# Patient Record
Sex: Female | Born: 1965 | Race: White | Hispanic: No | Marital: Married | Smoking: Current every day smoker
Health system: Southern US, Community
[De-identification: ages and names within clinical notes are randomized; demographics above are authoritative.]

## PROBLEM LIST (undated history)

## (undated) DIAGNOSIS — N1370 Vesicoureteral-reflux, unspecified: Secondary | ICD-10-CM

## (undated) DIAGNOSIS — J302 Other seasonal allergic rhinitis: Secondary | ICD-10-CM

## (undated) DIAGNOSIS — Z87442 Personal history of urinary calculi: Secondary | ICD-10-CM

## (undated) HISTORY — DX: Other seasonal allergic rhinitis: J30.2

## (undated) HISTORY — DX: Personal history of urinary calculi: Z87.442

## (undated) HISTORY — PX: BLADDER SURGERY: SHX569

## (undated) HISTORY — PX: TONSILLECTOMY AND ADENOIDECTOMY: SHX28

## (undated) HISTORY — DX: Vesicoureteral-reflux, unspecified: N13.70

---

## 1998-11-24 HISTORY — PX: BREAST SURGERY: SHX581

## 2000-05-25 ENCOUNTER — Other Ambulatory Visit: Admission: RE | Admit: 2000-05-25 | Discharge: 2000-05-25 | Payer: Self-pay | Admitting: Gynecology

## 2001-05-28 ENCOUNTER — Other Ambulatory Visit: Admission: RE | Admit: 2001-05-28 | Discharge: 2001-05-28 | Payer: Self-pay | Admitting: Obstetrics and Gynecology

## 2003-02-21 ENCOUNTER — Other Ambulatory Visit: Admission: RE | Admit: 2003-02-21 | Discharge: 2003-02-21 | Payer: Self-pay | Admitting: Gynecology

## 2003-08-19 ENCOUNTER — Emergency Department (HOSPITAL_COMMUNITY): Admission: EM | Admit: 2003-08-19 | Discharge: 2003-08-19 | Payer: Self-pay

## 2003-08-19 ENCOUNTER — Encounter: Payer: Self-pay | Admitting: Emergency Medicine

## 2004-06-19 ENCOUNTER — Emergency Department (HOSPITAL_COMMUNITY): Admission: EM | Admit: 2004-06-19 | Discharge: 2004-06-19 | Payer: Self-pay

## 2004-06-20 ENCOUNTER — Ambulatory Visit (HOSPITAL_COMMUNITY): Admission: RE | Admit: 2004-06-20 | Discharge: 2004-06-20 | Payer: Self-pay | Admitting: Family Medicine

## 2004-07-22 ENCOUNTER — Inpatient Hospital Stay (HOSPITAL_COMMUNITY): Admission: AD | Admit: 2004-07-22 | Discharge: 2004-07-22 | Payer: Self-pay | Admitting: Obstetrics and Gynecology

## 2004-10-11 ENCOUNTER — Other Ambulatory Visit: Admission: RE | Admit: 2004-10-11 | Discharge: 2004-10-11 | Payer: Self-pay | Admitting: Gynecology

## 2005-12-26 ENCOUNTER — Other Ambulatory Visit: Admission: RE | Admit: 2005-12-26 | Discharge: 2005-12-26 | Payer: Self-pay | Admitting: Gynecology

## 2007-01-19 ENCOUNTER — Ambulatory Visit (HOSPITAL_COMMUNITY): Admission: RE | Admit: 2007-01-19 | Discharge: 2007-01-19 | Payer: Self-pay | Admitting: Obstetrics and Gynecology

## 2007-02-09 ENCOUNTER — Ambulatory Visit (HOSPITAL_COMMUNITY): Admission: RE | Admit: 2007-02-09 | Discharge: 2007-02-09 | Payer: Self-pay | Admitting: Obstetrics and Gynecology

## 2007-03-10 ENCOUNTER — Ambulatory Visit (HOSPITAL_COMMUNITY): Admission: RE | Admit: 2007-03-10 | Discharge: 2007-03-10 | Payer: Self-pay | Admitting: Obstetrics and Gynecology

## 2007-03-26 ENCOUNTER — Ambulatory Visit (HOSPITAL_COMMUNITY): Admission: RE | Admit: 2007-03-26 | Discharge: 2007-03-26 | Payer: Self-pay | Admitting: Obstetrics and Gynecology

## 2007-04-09 ENCOUNTER — Ambulatory Visit (HOSPITAL_COMMUNITY): Admission: RE | Admit: 2007-04-09 | Discharge: 2007-04-09 | Payer: Self-pay | Admitting: Obstetrics and Gynecology

## 2007-05-05 ENCOUNTER — Inpatient Hospital Stay (HOSPITAL_COMMUNITY): Admission: AD | Admit: 2007-05-05 | Discharge: 2007-05-05 | Payer: Self-pay | Admitting: Obstetrics and Gynecology

## 2007-05-27 ENCOUNTER — Encounter: Admission: RE | Admit: 2007-05-27 | Discharge: 2007-05-27 | Payer: Self-pay | Admitting: Obstetrics and Gynecology

## 2007-07-31 ENCOUNTER — Inpatient Hospital Stay (HOSPITAL_COMMUNITY): Admission: AD | Admit: 2007-07-31 | Discharge: 2007-08-04 | Payer: Self-pay | Admitting: Obstetrics and Gynecology

## 2007-08-01 ENCOUNTER — Encounter (INDEPENDENT_AMBULATORY_CARE_PROVIDER_SITE_OTHER): Payer: Self-pay | Admitting: Obstetrics and Gynecology

## 2007-08-05 ENCOUNTER — Encounter: Admission: RE | Admit: 2007-08-05 | Discharge: 2007-09-03 | Payer: Self-pay | Admitting: Obstetrics and Gynecology

## 2009-06-21 ENCOUNTER — Ambulatory Visit: Payer: Self-pay | Admitting: Gynecology

## 2009-06-21 ENCOUNTER — Other Ambulatory Visit: Admission: RE | Admit: 2009-06-21 | Discharge: 2009-06-21 | Payer: Self-pay | Admitting: Gynecology

## 2009-06-21 ENCOUNTER — Encounter: Payer: Self-pay | Admitting: Gynecology

## 2009-06-28 ENCOUNTER — Ambulatory Visit: Payer: Self-pay | Admitting: Gynecology

## 2009-09-17 ENCOUNTER — Ambulatory Visit: Payer: Self-pay | Admitting: Gynecology

## 2009-10-24 ENCOUNTER — Encounter: Admission: RE | Admit: 2009-10-24 | Discharge: 2009-11-21 | Payer: Self-pay | Admitting: Gynecology

## 2010-03-11 ENCOUNTER — Other Ambulatory Visit: Admission: RE | Admit: 2010-03-11 | Discharge: 2010-03-11 | Payer: Self-pay | Admitting: Gynecology

## 2010-03-11 ENCOUNTER — Ambulatory Visit: Payer: Self-pay | Admitting: Gynecology

## 2010-12-15 ENCOUNTER — Encounter: Payer: Self-pay | Admitting: Obstetrics and Gynecology

## 2011-02-12 ENCOUNTER — Other Ambulatory Visit: Payer: Self-pay | Admitting: Gynecology

## 2011-02-12 ENCOUNTER — Ambulatory Visit (INDEPENDENT_AMBULATORY_CARE_PROVIDER_SITE_OTHER): Payer: 59 | Admitting: Gynecology

## 2011-02-12 DIAGNOSIS — R5383 Other fatigue: Secondary | ICD-10-CM

## 2011-02-12 DIAGNOSIS — N951 Menopausal and female climacteric states: Secondary | ICD-10-CM

## 2011-02-12 DIAGNOSIS — Q826 Congenital sacral dimple: Secondary | ICD-10-CM

## 2011-02-12 DIAGNOSIS — M545 Low back pain: Secondary | ICD-10-CM

## 2011-02-12 DIAGNOSIS — N912 Amenorrhea, unspecified: Secondary | ICD-10-CM

## 2011-02-17 ENCOUNTER — Ambulatory Visit (HOSPITAL_COMMUNITY)
Admission: RE | Admit: 2011-02-17 | Discharge: 2011-02-17 | Disposition: A | Discharge status: Home / Self Care | Payer: 59 | Source: Ambulatory Visit | Attending: Gynecology | Admitting: Gynecology

## 2011-02-17 ENCOUNTER — Other Ambulatory Visit: Payer: Self-pay | Admitting: Gynecology

## 2011-02-17 DIAGNOSIS — Q826 Congenital sacral dimple: Secondary | ICD-10-CM

## 2011-02-17 DIAGNOSIS — R52 Pain, unspecified: Secondary | ICD-10-CM

## 2011-02-17 DIAGNOSIS — N949 Unspecified condition associated with female genital organs and menstrual cycle: Secondary | ICD-10-CM | POA: Insufficient documentation

## 2011-02-21 ENCOUNTER — Ambulatory Visit (INDEPENDENT_AMBULATORY_CARE_PROVIDER_SITE_OTHER): Payer: 59 | Admitting: Gynecology

## 2011-02-21 DIAGNOSIS — Z79890 Hormone replacement therapy (postmenopausal): Secondary | ICD-10-CM

## 2011-02-21 DIAGNOSIS — N952 Postmenopausal atrophic vaginitis: Secondary | ICD-10-CM

## 2011-02-21 DIAGNOSIS — N951 Menopausal and female climacteric states: Secondary | ICD-10-CM

## 2011-04-08 NOTE — Discharge Summary (Signed)
Samantha Jensen, Samantha Jensen             ACCOUNT NO.:  [id number]   MEDICAL RECORD NO.:  [id number]          PATIENT TYPE:  INP   LOCATION:  9130                          FACILITY:  [facility]   PHYSICIAN:  Sherron Monday, MD        DATE OF BIRTH:  February 12, 1966   DATE OF ADMISSION:  07/31/2007  DATE OF DISCHARGE:  08/04/2007                               DISCHARGE SUMMARY   ADMITTING DIAGNOSIS:  Intrauterine pregnancy at term, spontaneous  rupture of membranes.   DISCHARGE DIAGNOSES:  Intrauterine pregnancy at term, spontaneous  rupture of membranes, delivered via spontaneous vaginal delivery.  Labor  complicated by fetal tachycardia, chorioamnionitis, and failure to  progress.   HISTORY OF PRESENT ILLNESS:  Patient is a 45 year old, G3, P0-0-2-0, at  40+ weeks with contractions, leaking fluid loss, and she also lost her  mucus plug.  She is uncomfortable with some of the contractions.  She  states she has had good fetal movement, some spotting.  Pregnancy was  complicated by being an IVF pregnancy, initially being a twin pregnancy  with demise of Twin B.  There was early vaginal bleeding during the  pregnancy secondary to a subchorionic hemorrhage and the pregnancy was  complicated by A1 gestational diabetes.   PAST MEDICAL HISTORY:  Significant for herpes, type 1.   PAST SURGICAL HISTORY:  Significant for tonsillectomy, left kidney  surgery, bladder surgery.   PAST OB/GYN HISTORY:  G1 and G2 were therapeutic abortions.  This is  confidential.  G3, the present pregnancy by IVF with donor eggs with the  demise of Twin B and a date of conception of November 02, 2006, giving  her an [facility] of July 28, 2007.  No abnormal Pap smears.  No sexually-  transmitted diseases.   MEDICATIONS:  Prenatal vitamins.   ALLERGIES:  No known drug allergies.   SOCIAL HISTORY:  Denies alcohol, tobacco or drug use.  She has a history  of tobacco use, but quit with the pregnancy.  She is married.   FAMILY  HISTORY:  Remarkable for congestive heart failure in maternal  grandfather.  Maternal grandmother with pancreatic cancer.  Father with  COPD.   PRENATAL LABS:  Hemoglobin 13.4, platelets 159,000.  A-negative,  antibody screen negative, gonorrhea negative, Chlamydia negative, RPR  nonreactive, rubella immune, hepatitis C surface antigen negative, HIV  negative.  Cystic fibrosis screen negative.  Glucola 197.  Two hour GTT  93, 184, 155, 125.  Group B strep was negative.   Ultrasound on June 18, 2007, at 34 weeks, revealed Baby A being 2537  grams, normal anatomy, posterior placenta, vertex and female infant.  B  being calcified in the upper uterus and as a demise.  Multiple other  ultrasounds initially revealing discordant growth, then fetal demise.  Most recent of which was July 12, 2007, revealing good growth, normal  anatomy, and a vertex presentation.   ADMISSION PHYSICAL EXAMINATION:  Afebrile, vital signs stable.  GENERAL:  No apparent distress.  CARDIOVASCULAR:  Regular rate and rhythm.  LUNGS:  Clear to auscultation bilaterally.  ABDOMEN:  Soft with a nontender fundus.  EXTREMITIES:  Symmetric and nontender.  FETAL HEART TONES:  135 and reactive.  TOCOMETRY:  Contracting every four to five minutes.  STERILE VAGINAL EXAM:  Revealed 1-2 cm dilated, 80% effaced and -3  station.  Sterile speculum exam revealed ferning, pooling and  questionable Nitrazine secondary to small amount of vaginal bleeding.   She was admitted for labor induction.  Her labor was augmented with  Pitocin.  Throughout her antepartum course, she slowly progressed with  rupture of forebag.  She had thin meconium.  An IUPC was placed with  amnioinfusion started, secondary to questionable meconium of possibly  dark fluid secondary to Baby B's demise.  Pitocin was continued to be  increased.  Patient was found to be 4-5 cm, 100% effaced at 0 station.  However, after vaginal exam at this point, fetal  tachycardia was noted  at 170s to 180s, consistent for a half hour.  Discussed with the patient  the SROM and slow or minimal change in the fetal tachycardia being  indications for cesarean section.  We discussed with her risks,  benefits, and alternatives, including bleeding, infection, damage to  surrounding organs, including, but not limited to, bowel, bladder,  ureters and blood vessels.  Also trouble healing.  We answered her  questions, so the decision was made to proceed with a cesarean section.  Viable female infant was delivered at 5:44 a.m. on September 7, with  Apgars of 8 at one minute and 9 at five minutes and a good venous gas of  7.31.  Weight was 8 pounds 14 ounces.  On delivery of the placentas,  they were sent to pathology and the small, skeletal remains of Twin B  were noted.  EBL was approximately 700 mL.   Her postoperative course was uncomplicated, remaining afebrile with  vital signs stable since she was maintained on gentamicin, ampicillin  and clindamycin initially, secondary to her fever and labor.  However,  she had some swelling and was changed to cefoxitin.  Her IV infiltrated  after approximately 24 hours and, as she had been afebrile, the  antibiotics were discontinued at this time.  The baby was transferred to  the NICU, secondary to tachypnea.  She desired discharge home on  postoperative day #3.  She was doing well, tolerating p.o., ambulating  well.  Her pain was well-controlled and normal lochia.  Benign exam.  Staples were removed.  She will be discharged to home with routine  discharge instructions and numbers to call for any questions or  problems, as well as prescriptions for Motrin, Vicodin and prenatal  vitamins and she will follow up in two weeks for an incision check.   DISCHARGE INFORMATION:  She is rubella-immune, A-negative.  Breast-  feeds.  Hemoglobin decreased from 12.2 to 8.8.  We will discuss  contraception at her postpartum checkups.   Prior to discharge, she was  given RhoGAM, secondary to the infant's Rh status.      Sherron Monday, MD  Electronically Signed     JB/MEDQ  D:  08/04/2007  T:  08/04/2007  Job:  [id number]

## 2011-04-08 NOTE — Op Note (Signed)
NAME:  Samantha Jensen, Samantha Jensen             ACCOUNT NO.:  [id number]   MEDICAL RECORD NO.:  [id number]          PATIENT TYPE:  INP   LOCATION:  9130                          FACILITY:  [facility]   PHYSICIAN:  Sherron Monday, MD        DATE OF BIRTH:  11/09/1966   DATE OF PROCEDURE:  08/01/2007  DATE OF DISCHARGE:                               OPERATIVE REPORT   PREOPERATIVE DIAGNOSIS:  Intrauterine pregnancy at term, history of  demise of twin B, chorioamnionitis, fetal tachycardia, arrest of  dilatation.   POSTOPERATIVE DIAGNOSES:  1. Intrauterine pregnancy at term, history of demise of twin B,      chorioamnionitis, fetal tachycardia, arrest of dilatation.  2. Delivered.   PROCEDURE:  Primary low transverse cesarean section.   ANESTHESIA:  Epidural.   SURGEON:  Sherron Monday, M.D.   FINDINGS:  A viable female infant at 5:44 a.m.  Apgars of a 8 at 1  minute and 9 5 minutes and a venous pH of 7.31.  Weight of 8 pounds 14  ounces.  Normal uterus, tubes, and ovaries.  Fetal demise noted with  placental extraction.   COMPLICATIONS:  None.   SPECIMENS:  Placenta to pathology.   IV FLUIDS:  500 mL   ESTIMATED BLOOD LOSS:  700 mL   URINE OUTPUT:  100 mL clear urine at the end of the procedure.   DISPOSITION:  Stable to PACU.   PROCEDURE:  After informed consent was reviewed with the patient in  light of minimal cervical change and rupture of membranes for  approximately 24 hours as well as fetal tachycardia, persistent despite  a fluid bolus, I discussed with her the risks, benefits, and  alternatives of a cesarean section including bleeding, infection, damage  to surrounding organs, including but not limited to bowel, bladder,  ureters, blood vessels, also the possibility of trouble healing with the  surgery.  We took her to the OR, redosed her epidural.  It was found to  be adequate.  She was then prepped and draped in the normal sterile  fashion.   A Pfannenstiel skin incision was made  approximately two fingerbreadths  above the pubic symphysis and carried through the underlying layer of  fascia sharply.  The fascia was incised in the midline, and the incision  was extended laterally with Mayo scissors.  The inferior aspect of the  fascial incision was grasped with Kocher clamps, and the rectus muscles  were dissected off both bluntly and sharply.  The superior aspect of the  incision was then grasped with Kocher clamps, and the rectus muscles  were dissected off both bluntly and sharply.  The midline was easily  identified, and the peritoneum was entered bluntly.  The Alexis wound  retractor was placed, making sure that no bowel was entrapped.  The  vesicouterine peritoneum was easily identified, tented up with pickups,  and the bladder flap was created both digitally and with Metzenbaum  scissors.  The uterus was incised in a transverse fashion.  The infant  was delivered from a vertex presentation.  Nose and mouth were clamped  on the field, and the infant was handed off to the awaiting pediatric  staff.  The placenta was expressed from the uterus.  As noted above, the  fetal demise was noted with the extraction of the placenta.  The uterus  was cleared of all clots and debris and exteriorized.  The uterine  incision was outlined with ring forceps.  A small cervical extension was  noted and included in the repair.  The uterus was closed in two layers,  the first of which as a running locked layer of 0 Monocryl, the second  as an imbricating layer.  Hemostasis was assured and confirmed with  Bovie cautery.  The peritoneal cavity was irrigated, and the uterus was  returned.  The clots were cleared from the gutters.  The peritoneum was  reapproximated using 2-0 Vicryl.  The subfascial planes were inspected  and found to be hemostatic.  The fascia was then closed with a single  suture of 0 Vicryl.  The subcuticular adipose layer was irrigated and  made hemostatic with  Bovie cautery.  The skin was closed with staples.   The patient tolerated the procedure well.  Sponge, lap, and needle count  was correct x2 per the operating room staff.      Sherron Monday, MD  Electronically Signed     JB/MEDQ  D:  08/01/2007  T:  08/01/2007  Job:  [id number]

## 2011-09-05 LAB — CBC
HCT: 25.4 — ABNORMAL LOW
Hemoglobin: 12.2
MCHC: 34.6
MCHC: 34.7
MCV: 87
MCV: 87.6
Platelets: 179
RBC: 4.05
RDW: 14.5 — ABNORMAL HIGH
RDW: 14.5 — ABNORMAL HIGH
WBC: 18.9 — ABNORMAL HIGH

## 2011-09-11 LAB — RH IMMUNE GLOBULIN WORKUP (NOT WOMEN'S HOSP): ABO/RH(D): A NEG

## 2012-02-28 ENCOUNTER — Other Ambulatory Visit: Payer: Self-pay | Admitting: Gynecology

## 2012-06-04 ENCOUNTER — Encounter: Payer: Self-pay | Admitting: Gynecology

## 2012-06-04 ENCOUNTER — Ambulatory Visit (INDEPENDENT_AMBULATORY_CARE_PROVIDER_SITE_OTHER): Payer: BC Managed Care – PPO | Admitting: Gynecology

## 2012-06-04 VITALS — BP 110/70 | Ht 60.75 in | Wt 141.0 lb

## 2012-06-04 DIAGNOSIS — E288 Other ovarian dysfunction: Secondary | ICD-10-CM

## 2012-06-04 DIAGNOSIS — Z01419 Encounter for gynecological examination (general) (routine) without abnormal findings: Secondary | ICD-10-CM

## 2012-06-04 DIAGNOSIS — Z87440 Personal history of urinary (tract) infections: Secondary | ICD-10-CM | POA: Insufficient documentation

## 2012-06-04 LAB — CBC WITH DIFFERENTIAL/PLATELET
Eosinophils Relative: 2 % (ref 0–5)
HCT: 45.5 % (ref 36.0–46.0)
Hemoglobin: 15.2 g/dL — ABNORMAL HIGH (ref 12.0–15.0)
Lymphocytes Relative: 22 % (ref 12–46)
Lymphs Abs: 2 10*3/uL (ref 0.7–4.0)
MCV: 87.8 fL (ref 78.0–100.0)
Monocytes Absolute: 0.7 10*3/uL (ref 0.1–1.0)
Monocytes Relative: 8 % (ref 3–12)
Neutro Abs: 6.1 10*3/uL (ref 1.7–7.7)
RBC: 5.18 MIL/uL — ABNORMAL HIGH (ref 3.87–5.11)
WBC: 9 10*3/uL (ref 4.0–10.5)

## 2012-06-04 LAB — GLUCOSE, RANDOM: Glucose, Bld: 84 mg/dL (ref 70–99)

## 2012-06-04 LAB — CHOLESTEROL, TOTAL: Cholesterol: 151 mg/dL (ref 0–200)

## 2012-06-04 MED ORDER — PROGESTERONE MICRONIZED 200 MG PO CAPS: ORAL_CAPSULE | ORAL | Status: AC

## 2012-06-04 MED ORDER — NITROFURANTOIN MONOHYD MACRO 100 MG PO CAPS: ORAL_CAPSULE | ORAL | Status: AC

## 2012-06-04 MED ORDER — ESTRADIOL 0.52 MG/0.87 GM (0.06%) TD GEL: TRANSDERMAL | Status: AC

## 2012-06-04 NOTE — Progress Notes (Signed)
Samantha Jensen [facility] [birth year] [id number]   History:    46 y.o.  for annual gyn exam with no complaints today. Patient last year was diagnosed as being the menopause early and was started on elestrin 0.06% which she applies transdermally daily with the addition of Prometrium 200 mg for 12 days of the month. She states that her vasomotor symptoms have completely resolved and she is doing well is having a light. At least once a month. She does take Macrobid 100 mg one by mouth after intercourse for her honeymoon cystitis. Patient frequently does her self breast examination and has not had a mammogram since 2007. In 46 year old has had operated on her for ureteral reflux. Patient with history of premature ovarian failure  Past medical history,surgical history, family history and social history were all reviewed and documented in the EPIC chart.  Gynecologic History Patient's last menstrual period was 05/14/2012. Contraception: none Last Pap: 2012. Results were: normal Last mammogram: 2007. Results were: normal  Obstetric History OB History    Grav Para Term Preterm Abortions TAB SAB Ect Mult Living   1 1 1       1      # Outc Date GA Lbr Len/2nd Wgt Sex Del Anes PTL Lv   1 TRM     F CS  No Yes       ROS: A ROS was performed and pertinent positives and negatives are included in the history.  GENERAL: No fevers or chills. HEENT: No change in vision, no earache, sore throat or sinus congestion. NECK: No pain or stiffness. CARDIOVASCULAR: No chest pain or pressure. No palpitations. PULMONARY: No shortness of breath, cough or wheeze. GASTROINTESTINAL: No abdominal pain, nausea, vomiting or diarrhea, melena or bright red blood per rectum. GENITOURINARY: No urinary frequency, urgency, hesitancy or dysuria. MUSCULOSKELETAL: No joint or muscle pain, no back pain, no recent trauma. DERMATOLOGIC: No rash, no itching, no lesions. ENDOCRINE: No polyuria, polydipsia, no heat or cold intolerance. No recent change  in weight. HEMATOLOGICAL: No anemia or easy bruising or bleeding. NEUROLOGIC: No headache, seizures, numbness, tingling or weakness. PSYCHIATRIC: No depression, no loss of interest in normal activity or change in sleep pattern.     Exam: chaperone present  BP 110/70  Ht 5' 0.75" (1.543 m)  Wt 141 lb (63.957 kg)  BMI 26.86 kg/m2  LMP 05/14/2012  Body mass index is 26.86 kg/(m^2).  General appearance : Well developed well nourished female. No acute distress HEENT: Neck supple, trachea midline, no carotid bruits, no thyroidmegaly Lungs: Clear to auscultation, no rhonchi or wheezes, or rib retractions  Heart: Regular rate and rhythm, no murmurs or gallops Breast:Examined in sitting and supine position were symmetrical in appearance, no palpable masses or tenderness,  no skin retraction, no nipple inversion, no nipple discharge, no skin discoloration, no axillary or supraclavicular lymphadenopathy Abdomen: no palpable masses or tenderness, no rebound or guarding Extremities: no edema or skin discoloration or tenderness  Pelvic:  Bartholin, Urethra, Skene Glands: Within normal limits             Vagina: No gross lesions or discharge  Cervix: No gross lesions or discharge  Uterus  anteverted, normal size, shape and consistency, non-tender and mobile  Adnexa  Without masses or tenderness  Anus and perineum  normal   Rectovaginal  normal sphincter tone without palpated masses or tenderness             Hemoccult not done     Assessment/Plan:  46 y.o.  female for annual exam with history of premature ovarian failure has done well on elestrin and Prometrium. She was reminded that she is overdue for mammogram requisition was provided. We discussed importance of monthly self breast examination. We also discussed the detrimental effects of smoking since she smokes half a pack cigarette daily. She was reminded take her calcium and vitamin D for osteoporosis prevention as well. The following labs  were were: CBC, blood sugar, cholesterol, TSH, and urinalysis. No Pap smear done today new screening guidelines discussed.    Ok Edwards MD, 9:38 AM 06/04/2012

## 2012-06-04 NOTE — Patient Instructions (Addendum)
Health Maintenance, Females A healthy lifestyle and preventative care can promote health and wellness.  Maintain regular health, dental, and eye exams.   Eat a healthy diet. Foods like vegetables, fruits, whole grains, low-fat dairy products, and lean protein foods contain the nutrients you need without too many calories. Decrease your intake of foods high in solid fats, added sugars, and salt. Get information about a proper diet from your caregiver, if necessary.   Regular physical exercise is one of the most important things you can do for your health. Most adults should get at least 150 minutes of moderate-intensity exercise (any activity that increases your heart rate and causes you to sweat) each week. In addition, most adults need muscle-strengthening exercises on 2 or more days a week.    Maintain a healthy weight. The body mass index (BMI) is a screening tool to identify possible weight problems. It provides an estimate of body fat based on height and weight. Your caregiver can help determine your BMI, and can help you achieve or maintain a healthy weight. For adults 20 years and older:   A BMI below 18.5 is considered underweight.   A BMI of 18.5 to 24.9 is normal.   A BMI of 25 to 29.9 is considered overweight.   A BMI of 30 and above is considered obese.   Maintain normal blood lipids and cholesterol by exercising and minimizing your intake of saturated fat. Eat a balanced diet with plenty of fruits and vegetables. Blood tests for lipids and cholesterol should begin at age 20 and be repeated every 5 years. If your lipid or cholesterol levels are high, you are over 50, or you are a high risk for heart disease, you may need your cholesterol levels checked more frequently.Ongoing high lipid and cholesterol levels should be treated with medicines if diet and exercise are not effective.   If you smoke, find out from your caregiver how to quit. If you do not use tobacco, do not start.    If you are pregnant, do not drink alcohol. If you are breastfeeding, be very cautious about drinking alcohol. If you are not pregnant and choose to drink alcohol, do not exceed 1 drink per day. One drink is considered to be 12 ounces (355 mL) of beer, 5 ounces (148 mL) of wine, or 1.5 ounces (44 mL) of liquor.   Avoid use of street drugs. Do not share needles with anyone. Ask for help if you need support or instructions about stopping the use of drugs.   High blood pressure causes heart disease and increases the risk of stroke. Blood pressure should be checked at least every 1 to 2 years. Ongoing high blood pressure should be treated with medicines, if weight loss and exercise are not effective.   If you are 55 to 45 years old, ask your caregiver if you should take aspirin to prevent strokes.   Diabetes screening involves taking a blood sample to check your fasting blood sugar level. This should be done once every 3 years, after age 45, if you are within normal weight and without risk factors for diabetes. Testing should be considered at a younger age or be carried out more frequently if you are overweight and have at least 1 risk factor for diabetes.   Breast cancer screening is essential preventative care for women. You should practice "breast self-awareness." This means understanding the normal appearance and feel of your breasts and may include breast self-examination. Any changes detected, no matter how   small, should be reported to a caregiver. Women in their 20s and 30s should have a clinical breast exam (CBE) by a caregiver as part of a regular health exam every 1 to 3 years. After age 40, women should have a CBE every year. Starting at age 40, women should consider having a mammogram (breast X-ray) every year. Women who have a family history of breast cancer should talk to their caregiver about genetic screening. Women at a high risk of breast cancer should talk to their caregiver about having  an MRI and a mammogram every year.   The Pap test is a screening test for cervical cancer. Women should have a Pap test starting at age 21. Between ages 21 and 29, Pap tests should be repeated every 2 years. Beginning at age 30, you should have a Pap test every 3 years as long as the past 3 Pap tests have been normal. If you had a hysterectomy for a problem that was not cancer or a condition that could lead to cancer, then you no longer need Pap tests. If you are between ages 65 and 70, and you have had normal Pap tests going back 10 years, you no longer need Pap tests. If you have had past treatment for cervical cancer or a condition that could lead to cancer, you need Pap tests and screening for cancer for at least 20 years after your treatment. If Pap tests have been discontinued, risk factors (such as a new sexual partner) need to be reassessed to determine if screening should be resumed. Some women have medical problems that increase the chance of getting cervical cancer. In these cases, your caregiver may recommend more frequent screening and Pap tests.   The human papillomavirus (HPV) test is an additional test that may be used for cervical cancer screening. The HPV test looks for the virus that can cause the cell changes on the cervix. The cells collected during the Pap test can be tested for HPV. The HPV test could be used to screen women aged 30 years and older, and should be used in women of any age who have unclear Pap test results. After the age of 30, women should have HPV testing at the same frequency as a Pap test.   Colorectal cancer can be detected and often prevented. Most routine colorectal cancer screening begins at the age of 50 and continues through age 75. However, your caregiver may recommend screening at an earlier age if you have risk factors for colon cancer. On a yearly basis, your caregiver may provide home test kits to check for hidden blood in the stool. Use of a small camera at  the end of a tube, to directly examine the colon (sigmoidoscopy or colonoscopy), can detect the earliest forms of colorectal cancer. Talk to your caregiver about this at age 50, when routine screening begins. Direct examination of the colon should be repeated every 5 to 10 years through age 75, unless early forms of pre-cancerous polyps or small growths are found.   Hepatitis C blood testing is recommended for all people born from 1945 through 1965 and any individual with known risks for hepatitis C.   Practice safe sex. Use condoms and avoid high-risk sexual practices to reduce the spread of sexually transmitted infections (STIs). Sexually active women aged 25 and younger should be checked for Chlamydia, which is a common sexually transmitted infection. Older women with new or multiple partners should also be tested for Chlamydia. Testing for other   STIs is recommended if you are sexually active and at increased risk.   Osteoporosis is a disease in which the bones lose minerals and strength with aging. This can result in serious bone fractures. The risk of osteoporosis can be identified using a bone density scan. Women ages 65 and over and women at risk for fractures or osteoporosis should discuss screening with their caregivers. Ask your caregiver whether you should be taking a calcium supplement or vitamin D to reduce the rate of osteoporosis.   Menopause can be associated with physical symptoms and risks. Hormone replacement therapy is available to decrease symptoms and risks. You should talk to your caregiver about whether hormone replacement therapy is right for you.   Use sunscreen with a sun protection factor (SPF) of 30 or greater. Apply sunscreen liberally and repeatedly throughout the day. You should seek shade when your shadow is shorter than you. Protect yourself by wearing long sleeves, pants, a wide-brimmed hat, and sunglasses year round, whenever you are outdoors.   Notify your caregiver  of new moles or changes in moles, especially if there is a change in shape or color. Also notify your caregiver if a mole is larger than the size of a pencil eraser.   Stay current with your immunizations.  Document Released: 05/26/2011 Document Revised: 10/30/2011 Document Reviewed: 05/26/2011 [employer] Patient Information 2012 [employer].Smoking Cessation This document explains the best ways for you to quit smoking and new treatments to help. It lists new medicines that can double or triple your chances of quitting and quitting for good. It also considers ways to avoid relapses and concerns you may have about quitting, including weight gain. NICOTINE: A POWERFUL ADDICTION If you have tried to quit smoking, you know how hard it can be. It is hard because nicotine is a very addictive drug. For some people, it can be as addictive as heroin or cocaine. Usually, people make 2 or 3 tries, or more, before finally being able to quit. Each time you try to quit, you can learn about what helps and what hurts. Quitting takes hard work and a lot of effort, but you can quit smoking. QUITTING SMOKING IS ONE OF THE MOST IMPORTANT THINGS YOU WILL EVER DO.  You will live longer, feel better, and live better.   The impact on your body of quitting smoking is felt almost immediately:   Within 20 minutes, blood pressure decreases. Pulse returns to its normal level.   After 8 hours, carbon monoxide levels in the blood return to normal. Oxygen level increases.   After 24 hours, chance of heart attack starts to decrease. Breath, hair, and body stop smelling like smoke.   After 48 hours, damaged nerve endings begin to recover. Sense of taste and smell improve.   After 72 hours, the body is virtually free of nicotine. Bronchial tubes relax and breathing becomes easier.   After 2 to 12 weeks, lungs can hold more air. Exercise becomes easier and circulation improves.   Quitting will reduce your risk of having a  heart attack, stroke, cancer, or lung disease:   After 1 year, the risk of coronary heart disease is cut in half.   After 5 years, the risk of stroke falls to the same as a nonsmoker.   After 10 years, the risk of lung cancer is cut in half and the risk of other cancers decreases significantly.   After 15 years, the risk of coronary heart disease drops, usually to the level of   a nonsmoker.   If you are pregnant, quitting smoking will improve your chances of having a healthy baby.   The people you live with, especially your children, will be healthier.   You will have extra money to spend on things other than cigarettes.  FIVE KEYS TO QUITTING Studies have shown that these 5 steps will help you quit smoking and quit for good. You have the best chances of quitting if you use them together: 1. Get ready.  2. Get support and encouragement.  3. Learn new skills and behaviors.  4. Get medicine to reduce your nicotine addiction and use it correctly.  5. Be prepared for relapse or difficult situations. Be determined to continue trying to quit, even if you do not succeed at first.  1. GET READY  Set a quit date.   Change your environment.   Get rid of ALL cigarettes, ashtrays, matches, and lighters in your home, car, and place of work.   Do not let people smoke in your home.   Review your past attempts to quit. Think about what worked and what did not.   Once you quit, do not smoke. NOT EVEN A PUFF!  2. GET SUPPORT AND ENCOURAGEMENT Studies have shown that you have a better chance of being successful if you have help. You can get support in many ways.  Tell your family, friends, and coworkers that you are going to quit and need their support. Ask them not to smoke around you.   Talk to your caregivers (doctor, dentist, nurse, pharmacist, psychologist, and/or smoking counselor).   Get individual, group, or telephone counseling and support. The more counseling you have, the better your  chances are of quitting. Programs are available at local hospitals and health centers. Call your local health department for information about programs in your area.   Spiritual beliefs and practices may help some smokers quit.   Quit meters are small computer programs online or downloadable that keep track of quit statistics, such as amount of "quit-time," cigarettes not smoked, and money saved.   Many smokers find one or more of the many self-help books available useful in helping them quit and stay off tobacco.  3. LEARN NEW SKILLS AND BEHAVIORS  Try to distract yourself from urges to smoke. Talk to someone, go for a walk, or occupy your time with a task.   When you first try to quit, change your routine. Take a different route to work. Drink tea instead of coffee. Eat breakfast in a different place.   Do something to reduce your stress. Take a hot bath, exercise, or read a book.   Plan something enjoyable to do every day. Reward yourself for not smoking.   Explore interactive web-based programs that specialize in helping you quit.  4. GET MEDICINE AND USE IT CORRECTLY Medicines can help you stop smoking and decrease the urge to smoke. Combining medicine with the above behavioral methods and support can quadruple your chances of successfully quitting smoking. The [place] Food and Drug Administration (FDA) has approved 7 medicines to help you quit smoking. These medicines fall into 3 categories.  Nicotine replacement therapy (delivers nicotine to your body without the negative effects and risks of smoking):   Nicotine gum: Available over-the-counter.   Nicotine lozenges: Available over-the-counter.   Nicotine inhaler: Available by prescription.   Nicotine nasal spray: Available by prescription.   Nicotine skin patches (transdermal): Available by prescription and over-the-counter.   Antidepressant medicine (helps people abstain from smoking,   but how this works is unknown):    Bupropion sustained-release (SR) tablets: Available by prescription.   Nicotinic receptor partial agonist (simulates the effect of nicotine in your brain):   Varenicline tartrate tablets: Available by prescription.   Ask your caregiver for advice about which medicines to use and how to use them. Carefully read the information on the package.   Everyone who is trying to quit may benefit from using a medicine. If you are pregnant or trying to become pregnant, nursing an infant, you are under age 18, or you smoke fewer than 10 cigarettes per day, talk to your caregiver before taking any nicotine replacement medicines.   You should stop using a nicotine replacement product and call your caregiver if you experience nausea, dizziness, weakness, vomiting, fast or irregular heartbeat, mouth problems with the lozenge or gum, or redness or swelling of the skin around the patch that does not go away.   Do not use any other product containing nicotine while using a nicotine replacement product.   Talk to your caregiver before using these products if you have diabetes, heart disease, asthma, stomach ulcers, you had a recent heart attack, you have high blood pressure that is not controlled with medicine, a history of irregular heartbeat, or you have been prescribed medicine to help you quit smoking.  5. BE PREPARED FOR RELAPSE OR DIFFICULT SITUATIONS  Most relapses occur within the first 3 months after quitting. Do not be discouraged if you start smoking again. Remember, most people try several times before they finally quit.   You may have symptoms of withdrawal because your body is used to nicotine. You may crave cigarettes, be irritable, feel very hungry, cough often, get headaches, or have difficulty concentrating.   The withdrawal symptoms are only temporary. They are strongest when you first quit, but they will go away within 10 to 14 days.  Here are some difficult situations to watch  for:  Alcohol. Avoid drinking alcohol. Drinking lowers your chances of successfully quitting.   Caffeine. Try to reduce the amount of caffeine you consume. It also lowers your chances of successfully quitting.   Other smokers. Being around smoking can make you want to smoke. Avoid smokers.   Weight gain. Many smokers will gain weight when they quit, usually less than 10 pounds. Eat a healthy diet and stay active. Do not let weight gain distract you from your main goal, quitting smoking. Some medicines that help you quit smoking may also help delay weight gain. You can always lose the weight gained after you quit.   Bad mood or depression. There are a lot of ways to improve your mood other than smoking.  If you are having problems with any of these situations, talk to your caregiver. SPECIAL SITUATIONS AND CONDITIONS Studies suggest that everyone can quit smoking. Your situation or condition can give you a special reason to quit.  Pregnant women/new mothers: By quitting, you protect your baby's health and your own.   Hospitalized patients: By quitting, you reduce health problems and help healing.   Heart attack patients: By quitting, you reduce your risk of a second heart attack.   Lung, head, and neck cancer patients: By quitting, you reduce your chance of a second cancer.   Parents of children and adolescents: By quitting, you protect your children from illnesses caused by secondhand smoke.  QUESTIONS TO THINK ABOUT Think about the following questions before you try to stop smoking. You may want to talk about your   answers with your caregiver.  Why do you want to quit?   If you tried to quit in the past, what helped and what did not?   What will be the most difficult situations for you after you quit? How will you plan to handle them?   Who can help you through the tough times? Your family? Friends? Caregiver?   What pleasures do you get from smoking? What ways can you still get  pleasure if you quit?  Here are some questions to ask your caregiver:  How can you help me to be successful at quitting?   What medicine do you think would be best for me and how should I take it?   What should I do if I need more help?   What is smoking withdrawal like? How can I get information on withdrawal?  Quitting takes hard work and a lot of effort, but you can quit smoking. FOR MORE INFORMATION  Smokefree.gov (http://www.smokefree.gov) provides free, accurate, evidence-based information and professional assistance to help support the immediate and long-term needs of people trying to quit smoking. Document Released: 11/04/2001 Document Revised: 10/30/2011 Document Reviewed: 08/27/2009 [employer] Patient Information 2012 [employer]. 

## 2012-06-05 LAB — URINALYSIS W MICROSCOPIC + REFLEX CULTURE
Bacteria, UA: NONE SEEN
Casts: NONE SEEN
Crystals: NONE SEEN
Hgb urine dipstick: NEGATIVE
Leukocytes, UA: NEGATIVE
Nitrite: NEGATIVE
Specific Gravity, Urine: 1.028 (ref 1.005–1.030)
Urobilinogen, UA: 0.2 mg/dL (ref 0.0–1.0)
pH: 5.5 (ref 5.0–8.0)

## 2012-06-25 ENCOUNTER — Other Ambulatory Visit: Payer: Self-pay | Admitting: *Deleted

## 2012-06-25 DIAGNOSIS — R928 Other abnormal and inconclusive findings on diagnostic imaging of breast: Secondary | ICD-10-CM

## 2012-06-28 ENCOUNTER — Other Ambulatory Visit: Payer: Self-pay | Admitting: Gynecology

## 2012-06-28 DIAGNOSIS — R928 Other abnormal and inconclusive findings on diagnostic imaging of breast: Secondary | ICD-10-CM

## 2012-06-29 ENCOUNTER — Encounter: Payer: Self-pay | Admitting: Gynecology

## 2012-12-23 ENCOUNTER — Other Ambulatory Visit: Payer: Self-pay | Admitting: *Deleted

## 2012-12-23 DIAGNOSIS — R921 Mammographic calcification found on diagnostic imaging of breast: Secondary | ICD-10-CM

## 2014-09-25 ENCOUNTER — Encounter: Payer: Self-pay | Admitting: Gynecology

## 2017-04-07 ENCOUNTER — Emergency Department (HOSPITAL_COMMUNITY): Payer: 59

## 2017-04-07 ENCOUNTER — Emergency Department (HOSPITAL_COMMUNITY)
Admission: EM | Admit: 2017-04-07 | Discharge: 2017-04-07 | Disposition: A | Discharge status: Home / Self Care | Payer: 59 | Attending: Emergency Medicine | Admitting: Emergency Medicine

## 2017-04-07 ENCOUNTER — Encounter (HOSPITAL_COMMUNITY): Payer: Self-pay | Admitting: *Deleted

## 2017-04-07 DIAGNOSIS — F17210 Nicotine dependence, cigarettes, uncomplicated: Secondary | ICD-10-CM | POA: Insufficient documentation

## 2017-04-07 DIAGNOSIS — N23 Unspecified renal colic: Secondary | ICD-10-CM | POA: Diagnosis not present

## 2017-04-07 DIAGNOSIS — Z79899 Other long term (current) drug therapy: Secondary | ICD-10-CM | POA: Insufficient documentation

## 2017-04-07 DIAGNOSIS — R109 Unspecified abdominal pain: Secondary | ICD-10-CM | POA: Diagnosis present

## 2017-04-07 LAB — COMPREHENSIVE METABOLIC PANEL WITH GFR
ALT: 19 U/L (ref 14–54)
AST: 20 U/L (ref 15–41)
Albumin: 4.1 g/dL (ref 3.5–5.0)
Alkaline Phosphatase: 91 U/L (ref 38–126)
Anion gap: 10 (ref 5–15)
BUN: 10 mg/dL (ref 6–20)
CO2: 21 mmol/L — ABNORMAL LOW (ref 22–32)
Calcium: 9 mg/dL (ref 8.9–10.3)
Chloride: 107 mmol/L (ref 101–111)
Creatinine, Ser: 0.88 mg/dL (ref 0.44–1.00)
GFR calc Af Amer: >60 mL/min
GFR calc non Af Amer: >60 mL/min
Glucose, Bld: 148 mg/dL — ABNORMAL HIGH (ref 65–99)
Potassium: 4.1 mmol/L (ref 3.5–5.1)
Sodium: 138 mmol/L (ref 135–145)
Total Bilirubin: 0.4 mg/dL (ref 0.3–1.2)
Total Protein: 6.9 g/dL (ref 6.5–8.1)

## 2017-04-07 LAB — URINALYSIS, ROUTINE W REFLEX MICROSCOPIC
Bilirubin Urine: NEGATIVE
GLUCOSE, UA: NEGATIVE mg/dL
Ketones, ur: NEGATIVE mg/dL
LEUKOCYTES UA: NEGATIVE
NITRITE: NEGATIVE
PH: 6 (ref 5.0–8.0)
Protein, ur: 30 mg/dL — AB
SPECIFIC GRAVITY, URINE: 1.015 (ref 1.005–1.030)

## 2017-04-07 LAB — CBC
HCT: 44.7 % (ref 36.0–46.0)
Hemoglobin: 14.8 g/dL (ref 12.0–15.0)
MCH: 29.3 pg (ref 26.0–34.0)
MCHC: 33.1 g/dL (ref 30.0–36.0)
MCV: 88.5 fL (ref 78.0–100.0)
Platelets: 222 10*3/uL (ref 150–400)
RBC: 5.05 MIL/uL (ref 3.87–5.11)
RDW: 13.7 % (ref 11.5–15.5)
WBC: 15.6 10*3/uL — ABNORMAL HIGH (ref 4.0–10.5)

## 2017-04-07 LAB — POC URINE PREG, ED: Preg Test, Ur: NEGATIVE

## 2017-04-07 LAB — LIPASE, BLOOD: Lipase: 30 U/L (ref 11–51)

## 2017-04-07 MED ORDER — ONDANSETRON HCL 4 MG/2ML IJ SOLN
4.0000 mg | Freq: Once | INTRAMUSCULAR | Status: AC | PRN
  Administered 2017-04-07: 4 mg via INTRAVENOUS
  Filled 2017-04-07: qty 2

## 2017-04-07 MED ORDER — TAMSULOSIN HCL 0.4 MG PO CAPS: 0.4000 mg | ORAL_CAPSULE | Freq: Every day | ORAL | 0 refills | Status: AC

## 2017-04-07 MED ORDER — OXYCODONE-ACETAMINOPHEN 5-325 MG PO TABS: 1.0000 | ORAL_TABLET | Freq: Four times a day (QID) | ORAL | 0 refills | Status: AC | PRN

## 2017-04-07 MED ORDER — IOPAMIDOL (ISOVUE-300) INJECTION 61%
INTRAVENOUS | Status: AC
  Administered 2017-04-07: 100 mL
  Filled 2017-04-07: qty 100

## 2017-04-07 MED ORDER — SODIUM CHLORIDE 0.9 % IV BOLUS (SEPSIS)
1000.0000 mL | Freq: Once | INTRAVENOUS | Status: AC
  Administered 2017-04-07: 1000 mL via INTRAVENOUS

## 2017-04-07 MED ORDER — IBUPROFEN 600 MG PO TABS: 600.0000 mg | ORAL_TABLET | Freq: Three times a day (TID) | ORAL | 0 refills | Status: AC | PRN

## 2017-04-07 MED ORDER — FENTANYL CITRATE (PF) 100 MCG/2ML IJ SOLN
50.0000 ug | INTRAMUSCULAR | Status: DC | PRN
  Administered 2017-04-07: 50 ug via INTRAVENOUS
  Filled 2017-04-07: qty 2

## 2017-04-07 MED ORDER — ONDANSETRON HCL 4 MG PO TABS: 4.0000 mg | ORAL_TABLET | Freq: Four times a day (QID) | ORAL | 0 refills | Status: AC | PRN

## 2017-04-07 NOTE — ED Notes (Signed)
Pt c/o RLQ pain worsening this morning. Has been having abd discomfort x 1 week. Also noticed blood in the commode following a BM but no blood noted when wiping. Pt has a hx of kidney stones, feels this pain is different. Pt clammy and uncomfortable on arrival

## 2017-04-07 NOTE — ED Provider Notes (Signed)
[facility]-EMERGENCY DEPT Provider Note   CSN: [id number] Arrival date & time: 04/07/17  4098     History   Chief Complaint Chief Complaint  Patient presents with  . Abdominal Pain    HPI Samantha Jensen is a 51 y.o. female.  HPI Intermittent right sided abdominal pain for the past few weeks. Pain is worsened over the last day. Associated with nausea and vomiting. Worsened with food. Patient says she's had loose stools. Denies fever or chills. Denies urinary symptoms including dysuria, frequency or hematuria. States abdominal pain radiates to her back. Has improved after pain medication and nausea medication. Past Medical History:  Diagnosis Date  . History of nephrolithiasis    left  . Seasonal allergies   . Ureteral reflux    hx of.. left kidney and scarr tissue repair    Patient Active Problem List   Diagnosis Date Noted  . Hx of cystitis 06/04/2012  . Premature ovarian failure 06/04/2012    Past Surgical History:  Procedure Laterality Date  . BLADDER SURGERY    . BREAST SURGERY  2000   left breast cyst aspiration   . TONSILLECTOMY AND ADENOIDECTOMY      OB History    Gravida Para Term Preterm AB Living   1 1 1     1    SAB TAB Ectopic Multiple Live Births           1       Home Medications    Prior to Admission medications   Medication Sig Start Date End Date Taking? Authorizing Provider  DiphenhydrAMINE HCl, Sleep, (ZZZQUIL) 25 MG CAPS Take 25 mg by mouth at bedtime as needed. Sleep   Yes [provider]  Estradiol (ELESTRIN) 0.52 MG/0.87 GM (0.06%) GEL Apply one pump to one arm daily Patient not taking: Reported on 04/07/2017 06/04/12   Ok Edwards, MD  ibuprofen (ADVIL,MOTRIN) 600 MG tablet Take 1 tablet (600 mg total) by mouth 3 (three) times daily with meals as needed. 04/07/17   Loren Racer, MD  nitrofurantoin, macrocrystal-monohydrate, (MACROBID) 100 MG capsule Take one tablet after intercourse Patient not taking: Reported on  04/07/2017 06/04/12   Ok Edwards, MD  ondansetron [facility]) 4 MG tablet Take 1 tablet (4 mg total) by mouth every 6 (six) hours as needed for nausea or vomiting. 04/07/17   Loren Racer, MD  oxyCODONE-acetaminophen (PERCOCET) 5-325 MG tablet Take 1-2 tablets by mouth every 6 (six) hours as needed for severe pain. 04/07/17   Loren Racer, MD  progesterone (PROMETRIUM) 200 MG capsule One tablet by mouth first 12 days of the month Patient not taking: Reported on 04/07/2017 06/04/12   Ok Edwards, MD  tamsulosin (FLOMAX) 0.4 MG CAPS capsule Take 1 capsule (0.4 mg total) by mouth daily. 04/07/17   Loren Racer, MD    Family History Family History  Problem Relation Age of Onset  . Heart disease Maternal Grandfather   . Cancer Maternal Aunt        SKIN    Social History Social History  Substance Use Topics  . Smoking status: Current Every Day Smoker    Packs/day: 0.50    Types: Cigarettes  . Smokeless tobacco: Never Used  . Alcohol use Yes     Comment: social     Allergies   Lobster [shellfish allergy]   Review of Systems Review of Systems  Constitutional: Negative for chills and fever.  Respiratory: Negative for cough and shortness of breath.   Cardiovascular: Negative  for chest pain.  Gastrointestinal: Positive for abdominal pain, diarrhea, nausea and vomiting. Negative for constipation.  Genitourinary: Negative for dysuria, flank pain, frequency and hematuria.  Musculoskeletal: Positive for back pain. Negative for myalgias, neck pain and neck stiffness.  Skin: Negative for rash and wound.  Neurological: Negative for dizziness, weakness, light-headedness, numbness and headaches.  All other systems reviewed and are negative.    Physical Exam Updated Vital Signs BP 123/73 (BP Location: Left Arm)   Pulse 60   Temp 97.8 F (36.6 C) (Oral)   Resp 16   Ht 5\' 1"  (1.549 m)   Wt 160 lb (72.6 kg)   LMP 05/14/2012   SpO2 97%   BMI 30.23 kg/m   Physical  Exam  Constitutional: She is oriented to person, place, and time. She appears well-developed and well-nourished. No distress.  HENT:  Head: Normocephalic and atraumatic.  Mouth/Throat: Oropharynx is clear and moist. No oropharyngeal exudate.  Eyes: EOM are normal. Pupils are equal, round, and reactive to light.  Neck: Normal range of motion. Neck supple.  Cardiovascular: Normal rate and regular rhythm.   Pulmonary/Chest: Effort normal and breath sounds normal.  Abdominal: Soft. Bowel sounds are normal. There is tenderness (mild right upper quadrant tenderness to palpation. No rebound or guarding.). There is no rebound and no guarding.  Musculoskeletal: Normal range of motion. She exhibits no edema or tenderness.  No CVA tenderness bilaterally. No lower extremity swelling or asymmetry.  Neurological: She is alert and oriented to person, place, and time.  Moving all extremities without deficit. Sensation fully intact.  Skin: Skin is warm and dry. Capillary refill takes less than 2 seconds. No rash noted. She is not diaphoretic. No erythema.  Psychiatric: She has a normal mood and affect. Her behavior is normal.  Nursing note and vitals reviewed.    ED Treatments / Results  Labs (all labs ordered are listed, but only abnormal results are displayed) Labs Reviewed  COMPREHENSIVE METABOLIC PANEL - Abnormal; Notable for the following:       Result Value   CO2 21 (*)    Glucose, Bld 148 (*)    All other components within normal limits  CBC - Abnormal; Notable for the following:    WBC 15.6 (*)    All other components within normal limits  URINALYSIS, ROUTINE W REFLEX MICROSCOPIC - Abnormal; Notable for the following:    APPearance HAZY (*)    Hgb urine dipstick LARGE (*)    Protein, ur 30 (*)    Bacteria, UA FEW (*)    Squamous Epithelial / LPF 0-5 (*)    All other components within normal limits  LIPASE, BLOOD  POC URINE PREG, ED    EKG  EKG Interpretation None        Radiology Ct Abdomen Pelvis W Contrast  Result Date: 04/07/2017 CLINICAL DATA:  Right-sided abdominal pain. EXAM: CT ABDOMEN AND PELVIS WITH CONTRAST TECHNIQUE: Multidetector CT imaging of the abdomen and pelvis was performed using the standard protocol following bolus administration of intravenous contrast. CONTRAST:  ISOVUE-300 IOPAMIDOL (ISOVUE-300) INJECTION 61% COMPARISON:  Ultrasound of same day. FINDINGS: Lower chest: No acute abnormality. Hepatobiliary: No gallstones are noted. Left hepatic cyst is noted. 2 cm rounded enhancing abnormality is noted in the lateral segment of the left hepatic lobe. Pancreas: Unremarkable. No pancreatic ductal dilatation or surrounding inflammatory changes. Spleen: Normal in size without focal abnormality. Adrenals/Urinary Tract: Adrenal glands appear normal. Minimal right hydronephrosis is noted secondary to 3 mm calculus  at right ureteropelvic junction. Multiple cysts are seen in the lower pole of left kidney, with 9 mm calculus seen in lower pole calyx. Urinary bladder appears normal. Stomach/Bowel: Stomach is within normal limits. Appendix appears normal. No evidence of bowel wall thickening, distention, or inflammatory changes. Vascular/Lymphatic: No significant vascular findings are present. No enlarged abdominal or pelvic lymph nodes. Reproductive: Uterus and bilateral adnexa are unremarkable. Other: No abnormal fluid collection is noted. Small fat containing periumbilical hernia is noted. Musculoskeletal: No acute or significant osseous findings. IMPRESSION: Minimal right hydronephrosis is noted secondary to 3 mm calculus at right ureteropelvic junction. Multiple cysts are noted in lower pole of left kidney, with 9 mm calculus seen in lower pole calyx. Small fat containing periumbilical hernia. 2 cm rounded enhancing abnormality is noted in lateral segment of left hepatic lobe which is not diagnostic for hemangioma on the basis of this study. Further  evaluation with MRI with and without gadolinium is recommended to evaluate for neoplasm or malignancy. Electronically Signed   By: Lupita Raider, M.D.   On: 04/07/2017 11:08   US Abdomen Limited  Result Date: 04/07/2017 CLINICAL DATA:  51 year old female with right upper quadrant abdominal pain for 1 week. Initial encounter. EXAM: US ABDOMEN LIMITED - RIGHT UPPER QUADRANT COMPARISON:  [facility] 08/25/2006 FINDINGS: Gallbladder: No gallstones or wall thickening visualized. No sonographic Murphy sign noted by sonographer. There is a small 4 mm echogenic gallbladder polyp suspected along the lower body (image 7), inconsequential. Common bile duct: Diameter: 4 mm, normal Liver: Liver echogenicity is at the upper limits of normal to mildly increased. Small 11 mm right lobe simple cysts suspected on image 44. In the lateral segment of the left hepatic lobe there is a larger 3.3 cm indistinct hypoechoic area (image 48). Other findings: Negative visible right kidney. IMPRESSION: 1. Negative gallbladder (inconsequential 4 mm polyp) and no evidence of biliary obstruction. 2. Nonspecific 3.3 cm hypoechoic area in the left lateral liver. Recommend non-emergent Abdomen MRI (liver protocol, without and with contrast) to further characterize. This recommendation was adapted from ACR consensus guidelines: Management of Incidental Liver Lesions on CT: A White Paper of the ACR Incidental Findings Committee. J Am Coll Radiol 2017; 16:1096-0454. Electronically Signed   By: Odessa Fleming M.D.   On: 04/07/2017 09:05    Procedures Procedures (including critical care time)  Medications Ordered in ED Medications  ondansetron (ZOFRAN) injection 4 mg (4 mg Intravenous Given 04/07/17 0655)  sodium chloride 0.9 % bolus 1,000 mL (0 mLs Intravenous Stopped 04/07/17 1050)  iopamidol (ISOVUE-300) 61 % injection (100 mLs  Contrast Given 04/07/17 1039)     Initial Impression / Assessment and Plan / ED Course  I have  reviewed the triage vital signs and the nursing notes.  Pertinent labs & imaging results that were available during my care of the patient were reviewed by me and considered in my medical decision making (see chart for details).     Patient is now pain-free. She has a 3 mm distal ureteral stone. Advised to follow-up with urology. Return precautions given.  Final Clinical Impressions(s) / ED Diagnoses   Final diagnoses:  Renal colic on right side    New Prescriptions Discharge Medication List as of 04/07/2017 12:17 PM    START taking these medications   Details  ondansetron (ZOFRAN) 4 MG tablet Take 1 tablet (4 mg total) by mouth every 6 (six) hours as needed for nausea or vomiting., Starting Tue 04/07/2017, Print  oxyCODONE-acetaminophen (PERCOCET) 5-325 MG tablet Take 1-2 tablets by mouth every 6 (six) hours as needed for severe pain., Starting Tue 04/07/2017, Print    tamsulosin (FLOMAX) 0.4 MG CAPS capsule Take 1 capsule (0.4 mg total) by mouth daily., Starting Tue 04/07/2017, Print         Loren RacerYelverton, Zoriana Oats, MD 04/08/17 [phone]

## 2017-10-02 IMAGING — CT CT ABD-PELV W/ CM
2 of 5 series · 16 of 46 positions shown, 18 images · IV contrast (iopamidol)
Comparison: Ultrasound of same day.

CLINICAL DATA: Right-sided abdominal pain.

EXAM:
CT ABDOMEN AND PELVIS WITH CONTRAST
TECHNIQUE: Multidetector CT imaging of the abdomen and pelvis was performed
using the standard protocol following bolus administration of
intravenous contrast.
CONTRAST:  100mL HI5MST-H88 IOPAMIDOL (HI5MST-H88) INJECTION 61%

[Series 3: abdroutine 5.0 i31s 1 · axial · 0.79mm/px · z∈[-521,-76]mm · 13 of 101 slices shown, 15 images]
[im 6/101  soft-tissue]
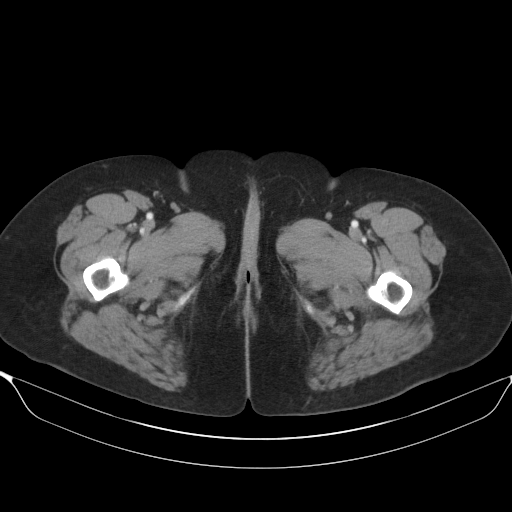
[im 6/101  bone]
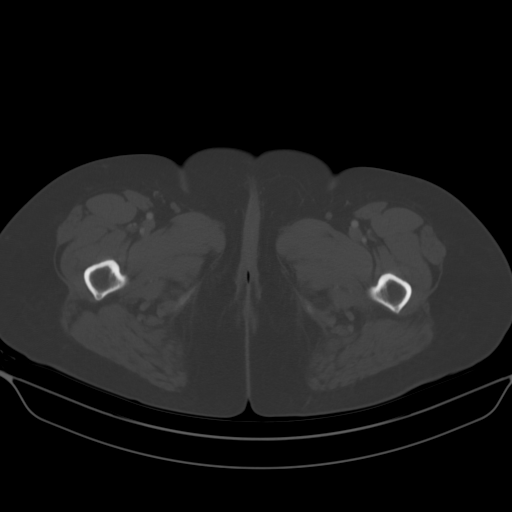
[im 16/101  soft-tissue]
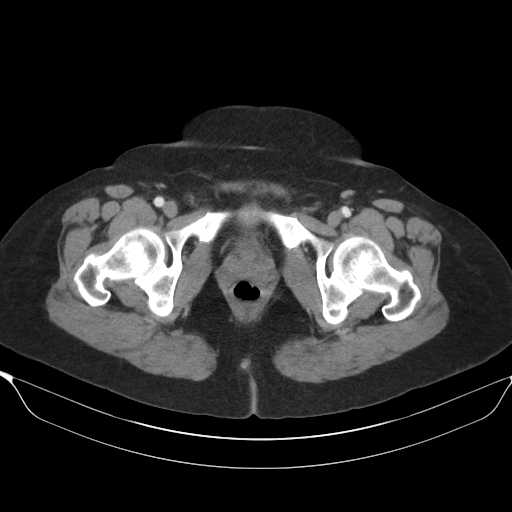
[im 22/101  soft-tissue]
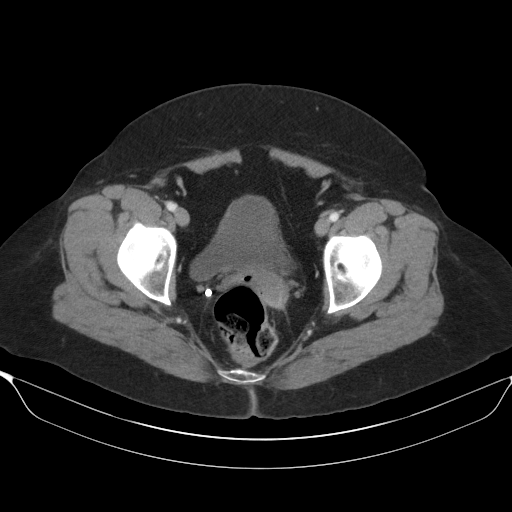
[im 27/101  soft-tissue]
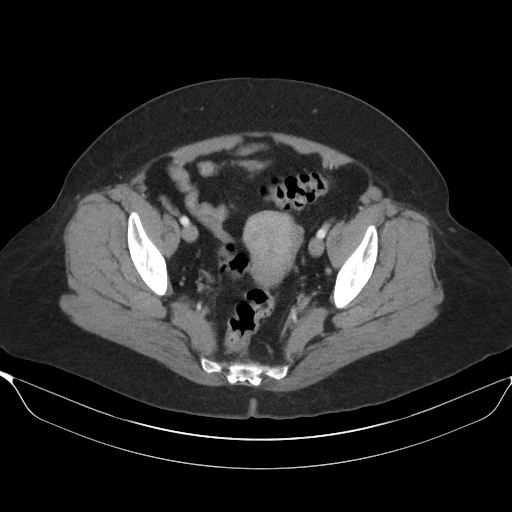
[im 37/101  soft-tissue]
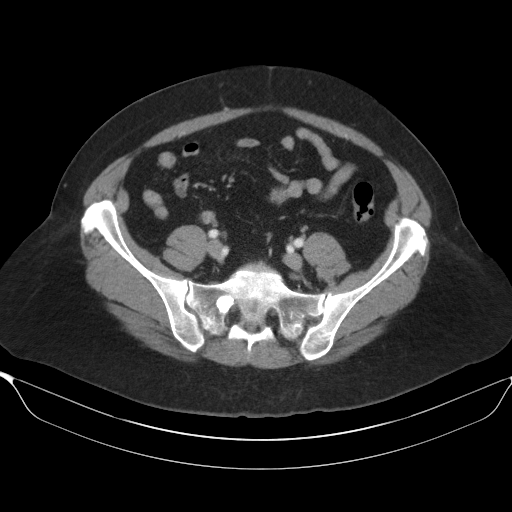
[im 43/101  soft-tissue]
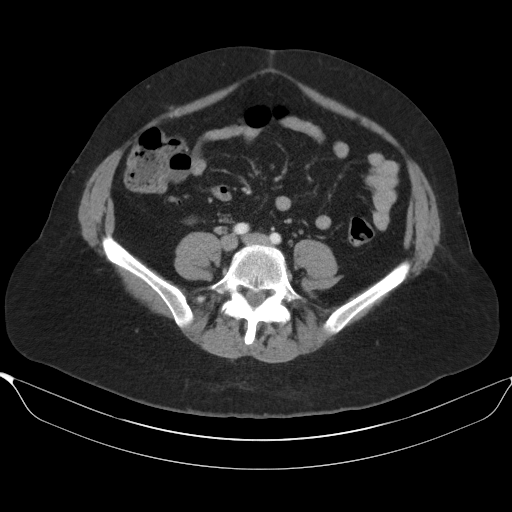
[im 53/101  soft-tissue]
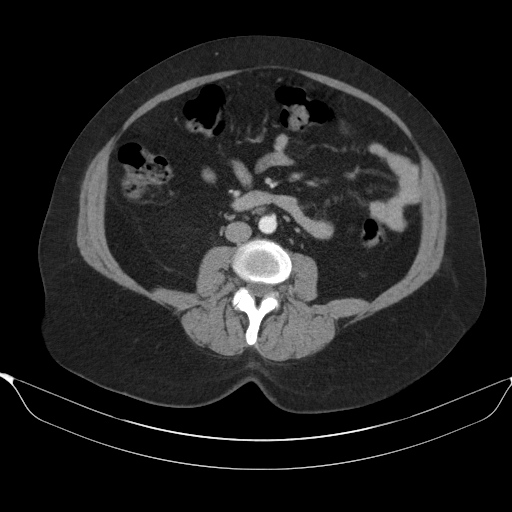
[im 58/101  soft-tissue]
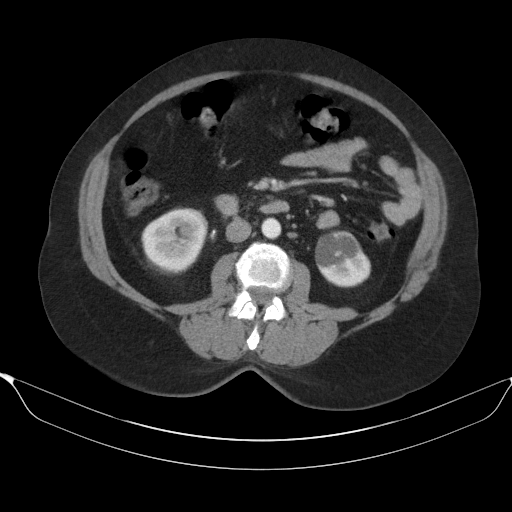
[im 64/101  soft-tissue]
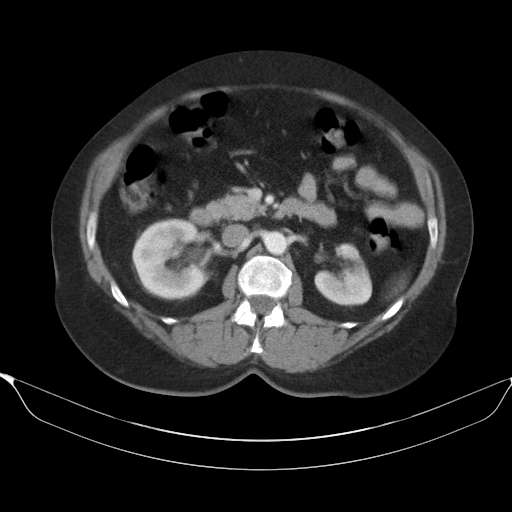
[im 64/101  bone]
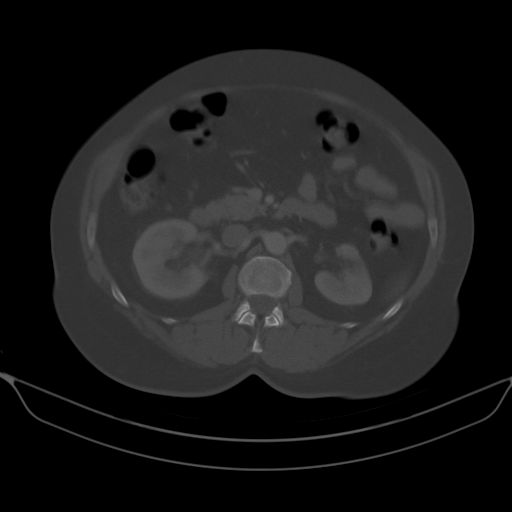
[im 74/101  soft-tissue]
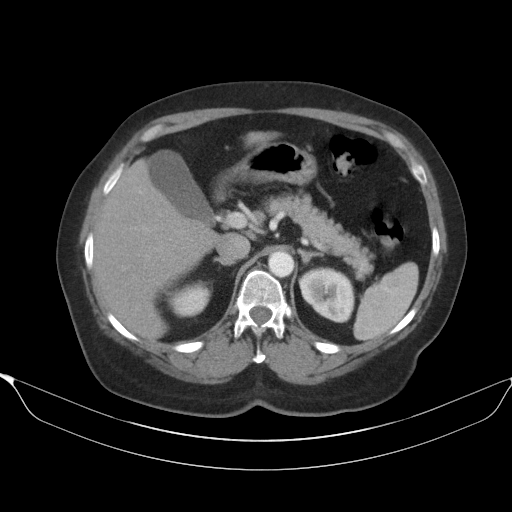
[im 79/101  soft-tissue]
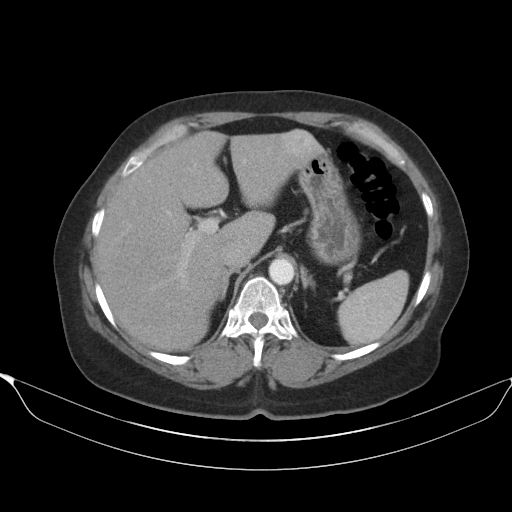
[im 85/101  soft-tissue]
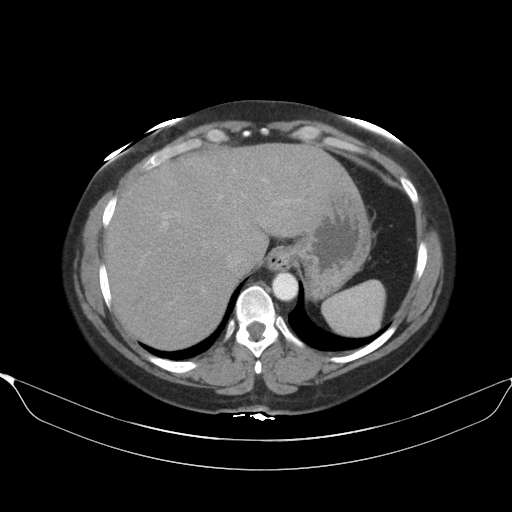
[im 95/101  soft-tissue]
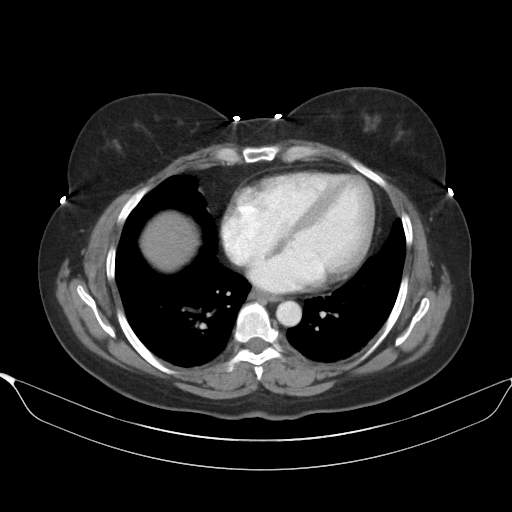

[Series 6: abdroutine 2.0 mpr cor · coronal · 0.75mm/px · 3 of 141 slices shown]
[im 47/141  soft-tissue]
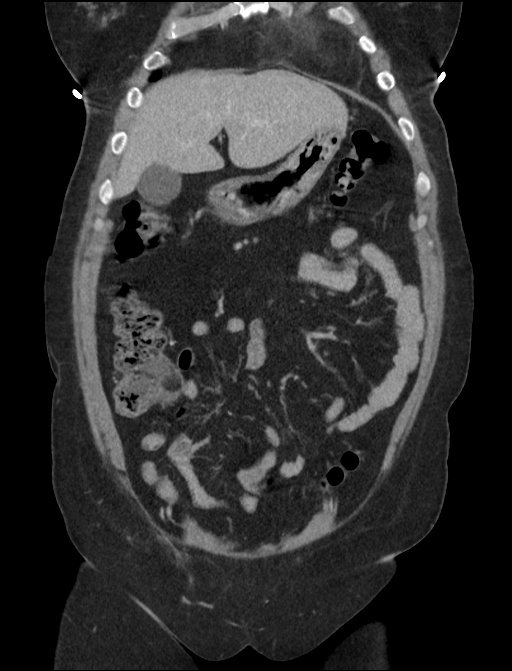
[im 63/141  soft-tissue]
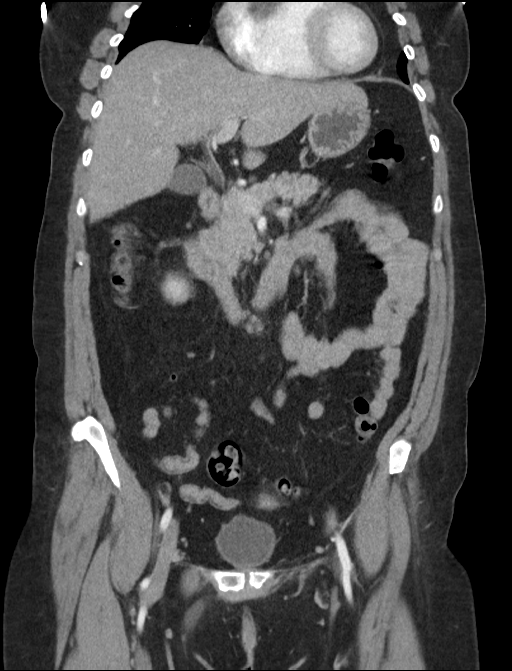
[im 78/141  soft-tissue]
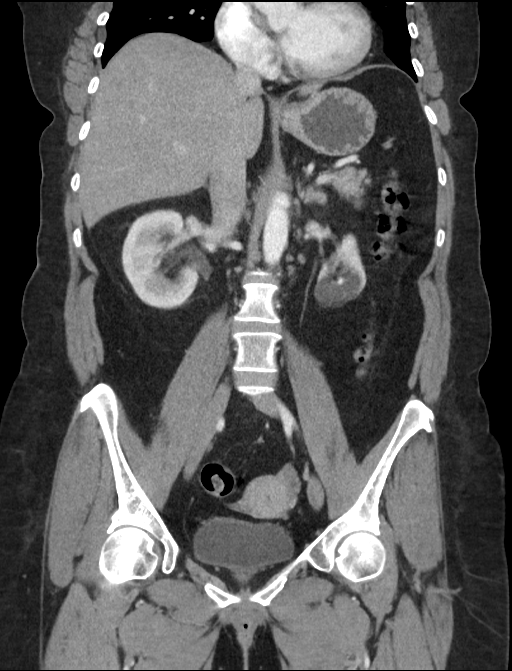

[16 of 46 positions shown; findings below may reference images not displayed]

FINDINGS: Lower chest: No acute abnormality.

Hepatobiliary: No gallstones are noted. Left hepatic cyst is noted.
2 cm rounded enhancing abnormality is noted in the lateral segment
of the left hepatic lobe.

Pancreas: Unremarkable. No pancreatic ductal dilatation or
surrounding inflammatory changes.

Spleen: Normal in size without focal abnormality.

Adrenals/Urinary Tract: Adrenal glands appear normal. Minimal right
hydronephrosis is noted secondary to 3 mm calculus at right
ureteropelvic junction. Multiple cysts are seen in the lower pole of
left kidney, with 9 mm calculus seen in lower pole calyx. Urinary
bladder appears normal.

Stomach/Bowel: Stomach is within normal limits. Appendix appears
normal. No evidence of bowel wall thickening, distention, or
inflammatory changes.

Vascular/Lymphatic: No significant vascular findings are present. No
enlarged abdominal or pelvic lymph nodes.

Reproductive: Uterus and bilateral adnexa are unremarkable.

Other: No abnormal fluid collection is noted. Small fat containing
periumbilical hernia is noted.

Musculoskeletal: No acute or significant osseous findings.
IMPRESSION: Minimal right hydronephrosis is noted secondary to 3 mm calculus at
right ureteropelvic junction.

Multiple cysts are noted in lower pole of left kidney, with 9 mm
calculus seen in lower pole calyx.

Small fat containing periumbilical hernia.

2 cm rounded enhancing abnormality is noted in lateral segment of
left hepatic lobe which is not diagnostic for hemangioma on the
basis of this study. Further evaluation with MRI with and without
gadolinium is recommended to evaluate for neoplasm or malignancy.

## 2020-12-06 ENCOUNTER — Other Ambulatory Visit: Payer: Managed Care, Other (non HMO)

## 2020-12-06 DIAGNOSIS — Z20822 Contact with and (suspected) exposure to covid-19: Secondary | ICD-10-CM

## 2020-12-09 LAB — NOVEL CORONAVIRUS, NAA: SARS-CoV-2, NAA: NOT DETECTED

## 2020-12-09 LAB — SARS-COV-2, NAA 2 DAY TAT

## 2021-12-07 DIAGNOSIS — N3000 Acute cystitis without hematuria: Secondary | ICD-10-CM | POA: Diagnosis not present

## 2022-02-13 DIAGNOSIS — N3281 Overactive bladder: Secondary | ICD-10-CM | POA: Diagnosis not present

## 2022-02-13 DIAGNOSIS — N200 Calculus of kidney: Secondary | ICD-10-CM | POA: Diagnosis not present

## 2022-11-04 DIAGNOSIS — Z131 Encounter for screening for diabetes mellitus: Secondary | ICD-10-CM | POA: Diagnosis not present

## 2022-11-04 DIAGNOSIS — E538 Deficiency of other specified B group vitamins: Secondary | ICD-10-CM | POA: Diagnosis not present

## 2022-11-04 DIAGNOSIS — Z0000 Encounter for general adult medical examination without abnormal findings: Secondary | ICD-10-CM | POA: Diagnosis not present

## 2022-11-04 DIAGNOSIS — Z8632 Personal history of gestational diabetes: Secondary | ICD-10-CM | POA: Diagnosis not present

## 2022-11-04 DIAGNOSIS — R5382 Chronic fatigue, unspecified: Secondary | ICD-10-CM | POA: Diagnosis not present

## 2022-11-04 DIAGNOSIS — Z130 Encounter for screening for diseases of the blood and blood-forming organs and certain disorders involving the immune mechanism: Secondary | ICD-10-CM | POA: Diagnosis not present

## 2022-11-04 DIAGNOSIS — Z13220 Encounter for screening for lipoid disorders: Secondary | ICD-10-CM | POA: Diagnosis not present

## 2022-11-04 DIAGNOSIS — E559 Vitamin D deficiency, unspecified: Secondary | ICD-10-CM | POA: Diagnosis not present

## 2022-11-05 ENCOUNTER — Other Ambulatory Visit: Payer: Self-pay | Admitting: Family Medicine

## 2022-11-05 DIAGNOSIS — K429 Umbilical hernia without obstruction or gangrene: Secondary | ICD-10-CM

## 2022-11-05 DIAGNOSIS — M6208 Separation of muscle (nontraumatic), other site: Secondary | ICD-10-CM

## 2022-11-12 ENCOUNTER — Ambulatory Visit
Admission: RE | Admit: 2022-11-12 | Discharge: 2022-11-12 | Disposition: A | Discharge status: Home / Self Care | Payer: Self-pay | Source: Ambulatory Visit | Attending: Family Medicine | Admitting: Family Medicine

## 2022-11-12 DIAGNOSIS — K429 Umbilical hernia without obstruction or gangrene: Secondary | ICD-10-CM

## 2022-11-12 DIAGNOSIS — M6208 Separation of muscle (nontraumatic), other site: Secondary | ICD-10-CM

## 2023-01-05 DIAGNOSIS — K429 Umbilical hernia without obstruction or gangrene: Secondary | ICD-10-CM | POA: Diagnosis not present

## 2023-02-12 DIAGNOSIS — K429 Umbilical hernia without obstruction or gangrene: Secondary | ICD-10-CM | POA: Diagnosis not present

## 2023-03-11 DIAGNOSIS — K429 Umbilical hernia without obstruction or gangrene: Secondary | ICD-10-CM | POA: Diagnosis not present

## 2023-03-11 DIAGNOSIS — Z8719 Personal history of other diseases of the digestive system: Secondary | ICD-10-CM | POA: Diagnosis not present

## 2023-03-11 DIAGNOSIS — Z9889 Other specified postprocedural states: Secondary | ICD-10-CM | POA: Diagnosis not present

## 2023-05-25 DIAGNOSIS — F419 Anxiety disorder, unspecified: Secondary | ICD-10-CM | POA: Diagnosis not present

## 2023-05-25 DIAGNOSIS — F17200 Nicotine dependence, unspecified, uncomplicated: Secondary | ICD-10-CM | POA: Diagnosis not present

## 2023-05-25 DIAGNOSIS — R7303 Prediabetes: Secondary | ICD-10-CM | POA: Diagnosis not present
